# Patient Record
Sex: Female | Born: 2000 | Race: White | Hispanic: No | Marital: Single | Smoking: Never smoker
Health system: Southern US, Community
[De-identification: ages and names within clinical notes are randomized; demographics above are authoritative.]

## PROBLEM LIST (undated history)

## (undated) DIAGNOSIS — R480 Dyslexia and alexia: Secondary | ICD-10-CM

## (undated) DIAGNOSIS — F32A Depression, unspecified: Secondary | ICD-10-CM

## (undated) DIAGNOSIS — F988 Other specified behavioral and emotional disorders with onset usually occurring in childhood and adolescence: Secondary | ICD-10-CM

## (undated) DIAGNOSIS — F329 Major depressive disorder, single episode, unspecified: Secondary | ICD-10-CM

## (undated) HISTORY — PX: WISDOM TOOTH EXTRACTION: SHX21

---
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
## 2022-02-07 DIAGNOSIS — Z419 Encounter for procedure for purposes other than remedying health state, unspecified: Secondary | ICD-10-CM | POA: Diagnosis not present

## 2022-02-11 DIAGNOSIS — N9089 Other specified noninflammatory disorders of vulva and perineum: Secondary | ICD-10-CM | POA: Diagnosis not present

## 2022-02-11 DIAGNOSIS — N898 Other specified noninflammatory disorders of vagina: Secondary | ICD-10-CM | POA: Diagnosis not present

## 2022-03-10 DIAGNOSIS — Z419 Encounter for procedure for purposes other than remedying health state, unspecified: Secondary | ICD-10-CM | POA: Diagnosis not present

## 2022-04-10 DIAGNOSIS — Z419 Encounter for procedure for purposes other than remedying health state, unspecified: Secondary | ICD-10-CM | POA: Diagnosis not present

## 2022-04-18 DIAGNOSIS — R6882 Decreased libido: Secondary | ICD-10-CM | POA: Diagnosis not present

## 2022-04-18 DIAGNOSIS — F419 Anxiety disorder, unspecified: Secondary | ICD-10-CM | POA: Diagnosis not present

## 2022-04-22 ENCOUNTER — Ambulatory Visit: Payer: Self-pay | Admitting: Family Medicine

## 2022-04-29 ENCOUNTER — Ambulatory Visit (INDEPENDENT_AMBULATORY_CARE_PROVIDER_SITE_OTHER): Payer: Medicaid Other | Admitting: Family Medicine

## 2022-04-29 ENCOUNTER — Encounter: Payer: Self-pay | Admitting: Family Medicine

## 2022-04-29 VITALS — BP 98/58 | HR 100 | Ht 65.5 in | Wt 121.0 lb

## 2022-04-29 DIAGNOSIS — E282 Polycystic ovarian syndrome: Secondary | ICD-10-CM | POA: Diagnosis not present

## 2022-04-29 DIAGNOSIS — F339 Major depressive disorder, recurrent, unspecified: Secondary | ICD-10-CM | POA: Diagnosis not present

## 2022-04-29 DIAGNOSIS — F17200 Nicotine dependence, unspecified, uncomplicated: Secondary | ICD-10-CM | POA: Diagnosis not present

## 2022-04-29 DIAGNOSIS — Z1159 Encounter for screening for other viral diseases: Secondary | ICD-10-CM

## 2022-04-29 DIAGNOSIS — F419 Anxiety disorder, unspecified: Secondary | ICD-10-CM | POA: Diagnosis not present

## 2022-04-29 DIAGNOSIS — Z114 Encounter for screening for human immunodeficiency virus [HIV]: Secondary | ICD-10-CM

## 2022-04-29 LAB — POCT GLYCOSYLATED HEMOGLOBIN (HGB A1C): Hemoglobin A1C: 5 % (ref 4.0–5.6)

## 2022-04-29 MED ORDER — NICOTINE 7 MG/24HR TD PT24: 7.0000 mg | MEDICATED_PATCH | Freq: Every day | TRANSDERMAL | 0 refills | Status: AC

## 2022-04-29 NOTE — Assessment & Plan Note (Signed)
Discussed positive #9 on PHQ-9 with patient, she does not have any active SI or HI and has not ever had any plan to hurt herself.  Her emotional support animals are protective factors for her.  Advised her to call us and the [phone] number as well as go to the emergency room should she start to have feelings of hurting herself.  Also advised her to continue to take her Lexapro and see her therapist regularly.  Will follow-up mood at visit.

## 2022-04-29 NOTE — Assessment & Plan Note (Addendum)
Diagnosed with her OBGYN by US findings. Has Liletta IUD in place with better control in dysfunctional uterine bleeding. No hirsutism appreciated on exam.  Collected lipid panel, A1c, CMP, and CBC.  Will follow results.

## 2022-04-29 NOTE — Assessment & Plan Note (Addendum)
On Lexapro 10 mg daily with improvement. Recommended continuing current dose and meeting with therapist. Provided release of information form for therapist records.  Patient's episodes of feeling lightheaded and dizzy, nauseous, and really hot with a sensation of being out of body could be consistent with panic attacks.  Her last TSH was normal.  Will follow-up at next visit given she has not had any further attacks since starting the Lexapro.

## 2022-04-29 NOTE — Patient Instructions (Signed)
It was great to see you today! Here's what we talked about:  I have collected labs today. I will follow up your results. Make an appointment in 1 month to discuss your mood on lexapro and if you are continuing to have the attacks of nausea, temperature changes, and dizziness. Be sure to stay hydrated and fed throughout the day and get good rest and exercise. These may help in the meantime.  Please let me know if you have any other questions.  Dr. Marcha Dutton

## 2022-04-29 NOTE — Progress Notes (Signed)
SUBJECTIVE:   CHIEF COMPLAINT / HPI:   New patient PMH: multiple ovarian follicles (PCOS) on [place] and evidence of 8.5 cm right ovarian cyst, lower abdominal pain and dysfunctional uterine bleeding, hx bacterial vaginosis, anxiety and depression seeing a therapist Tanna Furry LCSW at [phone]), ADHD, has hx of condition where she get lightheaded/dizzy, nauseous, temperature dysregulated, feels out of body; had seizure mother's day 2016 due to "stress" with no further episodes  Hosp/Surg: no hospitalizations but seen in ED for "busted neck veins in R neck" after falling off the bed with a friend, had wisdom teeth removed and removed impacted tooth "in sinus cavity"  Meds: Liletta IUD in place since May 2023, lexapro 10 mg daily since 04/18/22  Allergies: red dye 40 - need a lot of it and get lots of emesis and "internally itchy" and break out to hives if on skin, penicillin allergy in family but she has never had a reaction  FH: mother deceased from [place] with bipolar disorder, MS, PCN allergy, COPD, breast cancer, anxiety/depression, eating disorder, DM, heart disease; father deceased from cirrhosis had lung cancer; grandma with lung cancer; grandpa with heart disease  SH: lives in [place] and goes to [employer], nobody else in the home, boyfriend stays with her most nights, sexually active with 1 female partner, drinks socially, smokes weed 3 blunts a week, smokes vapes but trying to quit  OBJECTIVE:   BP (!) 98/58   Pulse 100   Ht 5' 5.5" (1.664 m)   Wt 121 lb (54.9 kg)   SpO2 98%   BMI 19.83 kg/m   General: Alert and oriented, in NAD Skin: Warm, dry, and intact; pedunculated hyperpigmented mole on right abdomen HEENT: NCAT, EOM grossly normal, midline nasal septum Cardiac: RRR, no m/r/g appreciated Respiratory: CTAB, breathing and speaking comfortably on RA Abdominal: Soft, nontender, nondistended, normoactive bowel sounds Extremities: Moves all extremities grossly  equally Neurological: No gross focal deficit Psychiatric: Appropriate mood and affect, PHQ-9 = 23 with positive #9 = 1  ASSESSMENT/PLAN:   PCOS (polycystic ovarian syndrome) Diagnosed with her OBGYN by US findings. Has Liletta IUD in place with better control in dysfunctional uterine bleeding. No hirsutism appreciated on exam.  Collected lipid panel, A1c, CMP, and CBC.  Will follow results.  Anxiety On Lexapro 10 mg daily with improvement. Recommended continuing current dose and meeting with therapist. Provided release of information form for therapist records.  Patient's episodes of feeling lightheaded and dizzy, nauseous, and really hot with a sensation of being out of body could be consistent with panic attacks.  Her last TSH was normal.  Will follow-up at next visit given she has not had any further attacks since starting the Lexapro.  Depression, recurrent ([place]) Discussed positive #9 on PHQ-9 with patient, she does not have any active SI or HI and has not ever had any plan to hurt herself.  Her emotional support animals are protective factors for her.  Advised her to call us and the 988 number as well as go to the emergency room should she start to have feelings of hurting herself.  Also advised her to continue to take her Lexapro and see her therapist regularly.  Will follow-up mood at visit.  Vaping nicotine dependence, non-tobacco product Patient vapes daily with an unknown amount of nicotine.  She is interested in cutting down.  Will prescribe 7 mg nicotine patches and advised her to let me know should these not work at this dose and can go  up as needed.   Health maintenance Last pap smear in 07/2022; patient will [facility]. Not due for repeat until 2027. HIV and HCV screening obtained today.  Ethelene Hal, MD [place]

## 2022-04-29 NOTE — Assessment & Plan Note (Signed)
Patient vapes daily with an unknown amount of nicotine.  She is interested in cutting down.  Will prescribe 7 mg nicotine patches and advised her to let me know should these not work at this dose and can go up as needed.

## 2022-04-30 LAB — COMPREHENSIVE METABOLIC PANEL
ALT: 12 IU/L (ref 0–32)
AST: 19 IU/L (ref 0–40)
Albumin/Globulin Ratio: 2.6 — ABNORMAL HIGH (ref 1.2–2.2)
Albumin: 5.1 g/dL — ABNORMAL HIGH (ref 4.0–5.0)
Alkaline Phosphatase: 77 IU/L (ref 44–121)
BUN/Creatinine Ratio: 10 (ref 9–23)
BUN: 7 mg/dL (ref 6–20)
Bilirubin Total: 0.6 mg/dL (ref 0.0–1.2)
CO2: 21 mmol/L (ref 20–29)
Calcium: 9.4 mg/dL (ref 8.7–10.2)
Chloride: 102 mmol/L (ref 96–106)
Creatinine, Ser: 0.67 mg/dL (ref 0.57–1.00)
Globulin, Total: 2 g/dL (ref 1.5–4.5)
Glucose: 86 mg/dL (ref 70–99)
Potassium: 3.7 mmol/L (ref 3.5–5.2)
Sodium: 139 mmol/L (ref 134–144)
Total Protein: 7.1 g/dL (ref 6.0–8.5)
eGFR: 126 mL/min/{1.73_m2} (ref >59)

## 2022-04-30 LAB — CBC WITH DIFFERENTIAL/PLATELET
Basophils Absolute: 0.1 10*3/uL (ref 0.0–0.2)
Basos: 1 %
EOS (ABSOLUTE): 0 10*3/uL (ref 0.0–0.4)
Eos: 0 %
Hematocrit: 44 % (ref 34.0–46.6)
Hemoglobin: 15 g/dL (ref 11.1–15.9)
Immature Grans (Abs): 0 10*3/uL (ref 0.0–0.1)
Immature Granulocytes: 0 %
Lymphocytes Absolute: 2.9 10*3/uL (ref 0.7–3.1)
Lymphs: 40 %
MCH: 29.9 pg (ref 26.6–33.0)
MCHC: 34.1 g/dL (ref 31.5–35.7)
MCV: 88 fL (ref 79–97)
Monocytes Absolute: 0.5 10*3/uL (ref 0.1–0.9)
Monocytes: 7 %
Neutrophils Absolute: 3.7 10*3/uL (ref 1.4–7.0)
Neutrophils: 52 %
Platelets: 237 10*3/uL (ref 150–450)
RBC: 5.01 x10E6/uL (ref 3.77–5.28)
RDW: 13 % (ref 11.7–15.4)
WBC: 7.2 10*3/uL (ref 3.4–10.8)

## 2022-04-30 LAB — HCV INTERPRETATION

## 2022-04-30 LAB — LIPID PANEL
Chol/HDL Ratio: 2.1 ratio (ref 0.0–4.4)
Cholesterol, Total: 156 mg/dL (ref 100–199)
HDL: 73 mg/dL (ref >39)
LDL Chol Calc (NIH): 71 mg/dL (ref 0–99)
Triglycerides: 56 mg/dL (ref 0–149)
VLDL Cholesterol Cal: 12 mg/dL (ref 5–40)

## 2022-04-30 LAB — HIV ANTIBODY (ROUTINE TESTING W REFLEX): HIV Screen 4th Generation wRfx: NONREACTIVE

## 2022-04-30 LAB — HCV AB W REFLEX TO QUANT PCR: HCV Ab: NONREACTIVE

## 2022-05-09 DIAGNOSIS — Z419 Encounter for procedure for purposes other than remedying health state, unspecified: Secondary | ICD-10-CM | POA: Diagnosis not present

## 2022-05-16 DIAGNOSIS — F419 Anxiety disorder, unspecified: Secondary | ICD-10-CM | POA: Diagnosis not present

## 2022-05-29 ENCOUNTER — Ambulatory Visit (INDEPENDENT_AMBULATORY_CARE_PROVIDER_SITE_OTHER): Payer: Medicaid Other | Admitting: Family Medicine

## 2022-05-29 ENCOUNTER — Other Ambulatory Visit: Payer: Self-pay

## 2022-05-29 VITALS — BP 114/72 | HR 93 | Ht 65.5 in | Wt 129.4 lb

## 2022-05-29 DIAGNOSIS — F17200 Nicotine dependence, unspecified, uncomplicated: Secondary | ICD-10-CM | POA: Diagnosis not present

## 2022-05-29 DIAGNOSIS — F339 Major depressive disorder, recurrent, unspecified: Secondary | ICD-10-CM | POA: Diagnosis not present

## 2022-05-29 DIAGNOSIS — L700 Acne vulgaris: Secondary | ICD-10-CM

## 2022-05-29 DIAGNOSIS — E282 Polycystic ovarian syndrome: Secondary | ICD-10-CM | POA: Diagnosis not present

## 2022-05-29 NOTE — Progress Notes (Signed)
    SUBJECTIVE:   CHIEF COMPLAINT / HPI:   Anxiety and depression Increased to 20 mg lexapro with OB/GYN at last visit. Depressive symptoms and anxious symptoms have improved. She is also able to think clearly. Has had some weight gain and "breaking out" with this. Had some stomach burning in the morning but this has improved. Wants to go to the gym and eat better to control weight first. Breaking out worse with starting medicine but not worse since increasing the dose. She uses hyaluronic acid in morning and micellar wash at night with cetaphil at times. She also uses a tool to pop some pimples.  PERTINENT  PMH / PSH: PCOS, ADHD  OBJECTIVE:   BP 114/72   Pulse 93   Ht 5' 5.5" (1.664 m)   Wt 129 lb 6.4 oz (58.7 kg)   SpO2 99%   BMI 21.21 kg/m   General: Alert and oriented, in NAD Skin: Warm, dry, and intact; multiple cystic papules mostly clustered around cheeks and jawline with some remaining hyperpigmentation HEENT: NCAT, EOM grossly normal, midline nasal septum Cardiac: RRR, no m/r/g appreciated Respiratory: CTAB, breathing and speaking comfortably on RA Abdominal: Nondistended Extremities: Moves all extremities grossly equally Neurological: No gross focal deficit Psychiatric: Appropriate mood and affect, PHQ-9 10 with negative #9  ASSESSMENT/PLAN:   PCOS (polycystic ovarian syndrome) Metabolic labs from last visit WNL. Consider cystic acne on face (and history of acne on back) to be secondary to PCOS vs SSRI. Will continue to monitor clinically.  Depression, recurrent ([place]) Much improved subjective mood and PHQ-9 since titration of lexapro to 20 mg. Weight gain and acne are both uncommon side effects, though will continue to monitor progression and adjust therapy as needed. Discussed exercise and nutritional changes for mild weight gain of 8 pounds. Discussed acne treatment as below. Follow up mood and symptoms in 1 month.  Vaping nicotine dependence, non-tobacco  product Has not used nicotine patches but does have them. She will start using these to cut down on vaping once she is ready.  Cystic acne vulgaris Worsened after starting lexapro on face. Has history of facial and back acne of similar appearance. Cystic appearance on exam; consider some element secondary to PCOS. Recommended against use of metal tools that could increase local irritation/damage. Discussed adding benzoyl peroxide +/- differin gel to regimen and assessing improvement. If continued symptoms, could discuss oral hormonal therapy given localized effects of IUD likely insufficient to control hormonal aspect.  Health maintenance States she is obtaining her pap smears with OB/GYN for now. She is going to obtain her next one in May 2024 (last note erroneously noted 07/2022 as most recent when patient explained 07/2021). Will continue to await records from OB/GYN.  Ethelene Hal, MD [place]

## 2022-05-29 NOTE — Assessment & Plan Note (Signed)
Much improved subjective mood and PHQ-9 since titration of lexapro to 20 mg. Weight gain and acne are both uncommon side effects, though will continue to monitor progression and adjust therapy as needed. Discussed exercise and nutritional changes for mild weight gain of 8 pounds. Discussed acne treatment as below. Follow up mood and symptoms in 1 month.

## 2022-05-29 NOTE — Assessment & Plan Note (Signed)
Has not used nicotine patches but does have them. She will start using these to cut down on vaping once she is ready.

## 2022-05-29 NOTE — Assessment & Plan Note (Signed)
Worsened after starting lexapro on face. Has history of facial and back acne of similar appearance. Cystic appearance on exam; consider some element secondary to PCOS. Recommended against use of metal tools that could increase local irritation/damage. Discussed adding benzoyl peroxide +/- differin gel to regimen and assessing improvement. If continued symptoms, could discuss oral hormonal therapy given localized effects of IUD likely insufficient to control hormonal aspect.

## 2022-05-29 NOTE — Patient Instructions (Signed)
It was great to see you today! Here's what we talked about:  Continue your current dose of lexapro. I am happy to hear it is helping. Come back in 1 month to see how you are doing on this dose and monitor your break outs.  Try to use some benzoyl peroxide daily with your current regimen for your break outs. You can also try differin gel if this does not work well. Great job with the exercise and diet changes! Keep it up!  Please let me know if you have any other questions. See you soon!  Dr. Marcha Dutton

## 2022-05-29 NOTE — Assessment & Plan Note (Signed)
Metabolic labs from last visit WNL. Consider cystic acne on face (and history of acne on back) to be secondary to PCOS vs SSRI. Will continue to monitor clinically.

## 2022-06-09 DIAGNOSIS — Z419 Encounter for procedure for purposes other than remedying health state, unspecified: Secondary | ICD-10-CM | POA: Diagnosis not present

## 2022-07-09 DIAGNOSIS — Z419 Encounter for procedure for purposes other than remedying health state, unspecified: Secondary | ICD-10-CM | POA: Diagnosis not present

## 2022-08-09 DIAGNOSIS — Z419 Encounter for procedure for purposes other than remedying health state, unspecified: Secondary | ICD-10-CM | POA: Diagnosis not present

## 2022-08-19 ENCOUNTER — Ambulatory Visit (INDEPENDENT_AMBULATORY_CARE_PROVIDER_SITE_OTHER): Payer: Medicaid Other | Admitting: Family Medicine

## 2022-08-19 VITALS — BP 104/67 | HR 85 | Ht 66.0 in | Wt 139.4 lb

## 2022-08-19 DIAGNOSIS — M25551 Pain in right hip: Secondary | ICD-10-CM | POA: Diagnosis not present

## 2022-08-19 DIAGNOSIS — L918 Other hypertrophic disorders of the skin: Secondary | ICD-10-CM | POA: Insufficient documentation

## 2022-08-19 NOTE — Patient Instructions (Addendum)
It was nice seeing you today!  Let me know if you would like physical therapy or to proceed with X-ray.  If you would like the skin tags removed, you can schedule a visit to have this done.  Stay well, Littie Deeds, MD [facility] [phone]  --  Make sure to check out at the front desk before you leave today.  Please arrive at least 15 minutes prior to your scheduled appointments.  If you had blood work today, I will send you a MyChart message or a letter if results are normal. Otherwise, I will give you a call.  If you had a referral placed, they will call you to set up an appointment. Please give [place] a call if you don't hear back in the next 2 weeks.  If you need additional refills before your next appointment, please call your pharmacy first.
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
## 2022-09-08 DIAGNOSIS — Z419 Encounter for procedure for purposes other than remedying health state, unspecified: Secondary | ICD-10-CM | POA: Diagnosis not present

## 2022-10-09 DIAGNOSIS — Z419 Encounter for procedure for purposes other than remedying health state, unspecified: Secondary | ICD-10-CM | POA: Diagnosis not present

## 2022-11-09 DIAGNOSIS — Z419 Encounter for procedure for purposes other than remedying health state, unspecified: Secondary | ICD-10-CM | POA: Diagnosis not present

## 2022-12-09 DIAGNOSIS — Z419 Encounter for procedure for purposes other than remedying health state, unspecified: Secondary | ICD-10-CM | POA: Diagnosis not present

## 2023-01-09 DIAGNOSIS — Z419 Encounter for procedure for purposes other than remedying health state, unspecified: Secondary | ICD-10-CM | POA: Diagnosis not present

## 2023-02-08 DIAGNOSIS — Z419 Encounter for procedure for purposes other than remedying health state, unspecified: Secondary | ICD-10-CM | POA: Diagnosis not present

## 2023-03-11 DIAGNOSIS — Z419 Encounter for procedure for purposes other than remedying health state, unspecified: Secondary | ICD-10-CM | POA: Diagnosis not present

## 2023-04-11 DIAGNOSIS — Z419 Encounter for procedure for purposes other than remedying health state, unspecified: Secondary | ICD-10-CM | POA: Diagnosis not present

## 2023-05-09 DIAGNOSIS — Z419 Encounter for procedure for purposes other than remedying health state, unspecified: Secondary | ICD-10-CM | POA: Diagnosis not present

## 2023-05-18 ENCOUNTER — Ambulatory Visit (INDEPENDENT_AMBULATORY_CARE_PROVIDER_SITE_OTHER): Payer: Medicaid Other | Admitting: Family Medicine

## 2023-05-18 ENCOUNTER — Ambulatory Visit
Admission: RE | Admit: 2023-05-18 | Discharge: 2023-05-18 | Disposition: A | Discharge status: Home / Self Care | Source: Ambulatory Visit | Attending: Family Medicine | Admitting: Family Medicine

## 2023-05-18 VITALS — BP 110/75 | HR 98 | Ht 66.0 in | Wt 178.6 lb

## 2023-05-18 DIAGNOSIS — L918 Other hypertrophic disorders of the skin: Secondary | ICD-10-CM | POA: Diagnosis not present

## 2023-05-18 DIAGNOSIS — R102 Pelvic and perineal pain: Secondary | ICD-10-CM | POA: Diagnosis not present

## 2023-05-18 DIAGNOSIS — N393 Stress incontinence (female) (male): Secondary | ICD-10-CM

## 2023-05-18 DIAGNOSIS — M25551 Pain in right hip: Secondary | ICD-10-CM

## 2023-05-18 DIAGNOSIS — F339 Major depressive disorder, recurrent, unspecified: Secondary | ICD-10-CM

## 2023-05-18 DIAGNOSIS — R32 Unspecified urinary incontinence: Secondary | ICD-10-CM | POA: Insufficient documentation

## 2023-05-18 DIAGNOSIS — M545 Low back pain, unspecified: Secondary | ICD-10-CM | POA: Diagnosis not present

## 2023-05-18 DIAGNOSIS — M25552 Pain in left hip: Secondary | ICD-10-CM

## 2023-05-18 MED ORDER — MELOXICAM 15 MG PO TABS: 15.0000 mg | ORAL_TABLET | Freq: Every day | ORAL | 0 refills | Status: AC

## 2023-05-18 NOTE — Assessment & Plan Note (Signed)
 Most likely etiology of persistent right hip pain.  Could also be some component of gluteal tendinopathy, as well.  Given chronicity and previous reported findings of hairline fracture, will collect repeat radiographs.  Will also try meloxicam 15 mg course for 7 days along with physical therapy referral.  Discussed potential for trochanteric bursa injections in the future if no improvement and x-rays unrevealing.

## 2023-05-18 NOTE — Patient Instructions (Addendum)
 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  Today you had a skin tag removed. Keep the bandage on for 24 hours before getting wet. Let me know if you have increasing pain, redness, discharge or develop any fevers, those these are unlikely.  Come back in 2 weeks for further assessment of your urinary incontinence.  Dental list         Updated 11.20.18 These dentists all accept Medicaid.  The list is a courtesy and for your convenience. Estos dentistas aceptan Medicaid.  La lista es para su [place] y es una cortesa.     [facility]     [phone] [address]  [address] [place] [address] Se habla espaol From 61 to 39 years old Parent may go with child only for cleaning Vinson Moselle DDS     [phone] Milus Banister, DDS (Spanish speaking) [address] [place]  [address] Se habla espaol From 20 to 77 years old Parent may go with child   Marolyn Hammock DMD    [phone] [address] [place] [address] Se habla espaol [place] spoken From 41 years old Parent may go with child [facility]     [phone] [address] [place] [address] Se habla espaol From 89 to 27 years old Parent may NOT go with child  Winfield Rast DDS  [phone] [facility]      [address]  [place]  [address] Se habla espaol [place] spoken (preferred to bring translator) From teeth coming in to 87 years old Parent may go with child  [facility]     [phone] [address] [place]. [place] [address] Requires certification. Call for information. Requiere certificacin. Llame para informacin. Algunos dias se habla espaol  From birth to 20 years Parent possibly goes with child   Bradd Canary DDS     [phone] [address] [place]  [address] [place] [address] Se habla espaol From 18 months to 18 years  Parent may go with child  [facility] DDS     Garlon Hatchet DDS  [phone] [address] [place] [address] Se habla espaol From 34 year old Parent may go with child   Melynda Ripple DDS    [phone] [address] [place] [address] Se habla espaol  From 18 months to 32 years old Parent may go with child Dorian Pod DDS    [phone] [address] [place] [address] Se habla espaol From 10 to 56 years old Parent may go with child  [facility]    [phone] [address] [place] [address] No se Wayne Sever From birth [facility]  [phone] [address] [place] [address] Se habla espanol Interpretation for other languages Special needs children welcome  Geryl Councilman, DDS PA     [phone] [address]  [place], [place] [address] From 25 years old   Special needs children welcome  [facility]   [phone] Dr. Orlean Patten [address] [place], [place] [address] Se habla espaol From birth to 12 years Special needs children welcome   [facility] [place] [phone] [address] [place], [place] [address]   [facility] Janyth Pupa [phone] [address] [place], [place] [address]

## 2023-05-18 NOTE — Assessment & Plan Note (Signed)
 Continue current management given improvement.

## 2023-05-18 NOTE — Assessment & Plan Note (Signed)
 Unable to fully address today due to time constraints.  Consider pelvic floor dysfunction, though would be atypical in a female her age without a history of childbearing.  Less likely UTI given chronicity.  However, at next visit in 2 weeks, recommend urine sample and possible pelvic exam for further elucidation.

## 2023-05-18 NOTE — Progress Notes (Signed)
    SUBJECTIVE:   CHIEF COMPLAINT / HPI:   Right hip pain Continued pain over the right hip.  Also has pain into the right buttocks.  Sometimes has pain that radiates down into the right leg.  No spontaneous loss of bowel or bladder control. Has made it hurt when exercising or hiking. Hurts out of the blue sometimes too. Massaging around the area does help. Never tried PT. Had an XR at the time of th injury around a year ago that was read as a hairline fracture.  Skin tag At the lower back. Has gotten larger over the last couple of months. Sometimes gets snagged on the clothes and is an uncomfortable sensation but does not bleed. Does have a history of skin tags as well as a family history.  Stress urinary incontinence Going on for 4-5 months.  This is happening whenever she sneezes or laughs.  She has not had any children.  Depression, anxiety Has had remarkable improvement with Lexapro.  PERTINENT  PMH / PSH: PCOS, depression, anxiety  OBJECTIVE:   BP 110/75   Pulse 98   Ht 5\' 6"  (1.676 m)   Wt 178 lb 9.6 oz (81 kg)   SpO2 96%   BMI 28.83 kg/m   General: Alert and oriented, in NAD Skin: Warm, dry, and intact; 1 pedunculated subcentimeter acrochordon at lower right back without evidence of irritation or bleeding HEENT: NCAT, EOM grossly normal, midline nasal septum Cardiac: RRR, no m/r/g appreciated Respiratory: CTAB, breathing and speaking comfortably on RA Abdominal: Nondistended, normoactive bowel sounds Extremities: Moves all extremities grossly equally Neurological: No gross focal deficit Psychiatric: Appropriate mood and affect   ASSESSMENT/PLAN:   Greater trochanteric pain syndrome of right lower extremity Most likely etiology of persistent right hip pain.  Could also be some component of gluteal tendinopathy, as well.  Given chronicity and previous reported findings of hairline fracture, will collect repeat radiographs.  Will also try meloxicam 15 mg course for 7  days along with physical therapy referral.  Discussed potential for trochanteric bursa injections in the future if no improvement and x-rays unrevealing.  Urinary incontinence Unable to fully address today due to time constraints.  Consider pelvic floor dysfunction, though would be atypical in a female her age without a history of childbearing.  Less likely UTI given chronicity.  However, at next visit in 2 weeks, recommend urine sample and possible pelvic exam for further elucidation.  Depression, recurrent (HCC) Continue current management given improvement.   Skin tag removal procedure note Pre-op Diagnosis: Skin tag Post-op Diagnosis: Same Procedure: Skin tag removal Location: Right lower back Performing Physician: Janeal Holmes, MD Supervising Physician (if applicable): Dr. Christella Hartigan  Informed consent was obtained prior to the procedure. Time out performed. The area surrounding the skin lesion was prepared and cleaned with with alcohol prep. The area was sufficiently anesthetized with 1% Lidocaine with epinephrine. A  sterile set of suture scissors  was used to obtain tissue sample and placed in labeled biopsy cup.  Pressure with gauze  was used to obtain hemostasis. The patient tolerated the procedure well without complications.  Standard post-procedure care was explained and return precautions and wound care handout given.   Health maintenance Patient was given list of dental offices that take Medicaid.  Janeal Holmes, MD [facility] [place]

## 2023-05-20 ENCOUNTER — Encounter: Payer: Self-pay | Admitting: Family Medicine

## 2023-05-20 DIAGNOSIS — Z419 Encounter for procedure for purposes other than remedying health state, unspecified: Secondary | ICD-10-CM | POA: Diagnosis not present

## 2023-05-20 LAB — DERMATOLOGY PATHOLOGY

## 2023-06-17 ENCOUNTER — Encounter: Payer: Self-pay | Admitting: Physical Therapy

## 2023-06-17 ENCOUNTER — Other Ambulatory Visit: Payer: Self-pay

## 2023-06-17 ENCOUNTER — Ambulatory Visit: Attending: Family Medicine | Admitting: Physical Therapy

## 2023-06-17 DIAGNOSIS — M6281 Muscle weakness (generalized): Secondary | ICD-10-CM | POA: Insufficient documentation

## 2023-06-17 DIAGNOSIS — R2689 Other abnormalities of gait and mobility: Secondary | ICD-10-CM | POA: Diagnosis not present

## 2023-06-17 DIAGNOSIS — M25561 Pain in right knee: Secondary | ICD-10-CM | POA: Insufficient documentation

## 2023-06-17 DIAGNOSIS — M25552 Pain in left hip: Secondary | ICD-10-CM | POA: Diagnosis not present

## 2023-06-17 DIAGNOSIS — M25551 Pain in right hip: Secondary | ICD-10-CM | POA: Diagnosis not present
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
## 2023-06-20 DIAGNOSIS — Z419 Encounter for procedure for purposes other than remedying health state, unspecified: Secondary | ICD-10-CM | POA: Diagnosis not present

## 2023-07-01 ENCOUNTER — Ambulatory Visit: Admitting: Physical Therapy

## 2023-07-01 DIAGNOSIS — M25561 Pain in right knee: Secondary | ICD-10-CM

## 2023-07-01 DIAGNOSIS — M6281 Muscle weakness (generalized): Secondary | ICD-10-CM

## 2023-07-01 DIAGNOSIS — R2689 Other abnormalities of gait and mobility: Secondary | ICD-10-CM | POA: Diagnosis not present

## 2023-07-01 DIAGNOSIS — M25552 Pain in left hip: Secondary | ICD-10-CM

## 2023-07-01 DIAGNOSIS — M25551 Pain in right hip: Secondary | ICD-10-CM
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
## 2023-07-08 ENCOUNTER — Ambulatory Visit: Admitting: Physical Therapy

## 2023-07-08 DIAGNOSIS — M25561 Pain in right knee: Secondary | ICD-10-CM

## 2023-07-08 DIAGNOSIS — M25551 Pain in right hip: Secondary | ICD-10-CM

## 2023-07-08 DIAGNOSIS — M25552 Pain in left hip: Secondary | ICD-10-CM

## 2023-07-08 DIAGNOSIS — M6281 Muscle weakness (generalized): Secondary | ICD-10-CM | POA: Diagnosis not present

## 2023-07-08 DIAGNOSIS — R2689 Other abnormalities of gait and mobility: Secondary | ICD-10-CM | POA: Diagnosis not present
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
## 2023-07-15 ENCOUNTER — Encounter: Payer: Self-pay | Admitting: Physical Therapy

## 2023-07-15 ENCOUNTER — Ambulatory Visit: Attending: Family Medicine | Admitting: Physical Therapy

## 2023-07-15 DIAGNOSIS — M25561 Pain in right knee: Secondary | ICD-10-CM | POA: Diagnosis not present

## 2023-07-15 DIAGNOSIS — M25552 Pain in left hip: Secondary | ICD-10-CM | POA: Insufficient documentation

## 2023-07-15 DIAGNOSIS — M25551 Pain in right hip: Secondary | ICD-10-CM | POA: Insufficient documentation

## 2023-07-15 DIAGNOSIS — M6281 Muscle weakness (generalized): Secondary | ICD-10-CM | POA: Diagnosis not present
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
## 2023-07-17 DIAGNOSIS — Z0000 Encounter for general adult medical examination without abnormal findings: Secondary | ICD-10-CM | POA: Diagnosis not present

## 2023-07-17 DIAGNOSIS — N761 Subacute and chronic vaginitis: Secondary | ICD-10-CM | POA: Diagnosis not present

## 2023-07-17 DIAGNOSIS — N898 Other specified noninflammatory disorders of vagina: Secondary | ICD-10-CM | POA: Diagnosis not present

## 2023-07-17 DIAGNOSIS — Z0001 Encounter for general adult medical examination with abnormal findings: Secondary | ICD-10-CM | POA: Diagnosis not present

## 2023-07-17 DIAGNOSIS — B9689 Other specified bacterial agents as the cause of diseases classified elsewhere: Secondary | ICD-10-CM | POA: Diagnosis not present

## 2023-07-17 DIAGNOSIS — Z975 Presence of (intrauterine) contraceptive device: Secondary | ICD-10-CM | POA: Diagnosis not present

## 2023-07-17 DIAGNOSIS — Z803 Family history of malignant neoplasm of breast: Secondary | ICD-10-CM | POA: Diagnosis not present

## 2023-07-17 DIAGNOSIS — Z113 Encounter for screening for infections with a predominantly sexual mode of transmission: Secondary | ICD-10-CM | POA: Diagnosis not present

## 2023-07-17 DIAGNOSIS — Z124 Encounter for screening for malignant neoplasm of cervix: Secondary | ICD-10-CM | POA: Diagnosis not present

## 2023-07-20 DIAGNOSIS — Z419 Encounter for procedure for purposes other than remedying health state, unspecified: Secondary | ICD-10-CM | POA: Diagnosis not present

## 2023-07-22 ENCOUNTER — Ambulatory Visit: Admitting: Physical Therapy

## 2023-07-22 ENCOUNTER — Encounter: Payer: Self-pay | Admitting: Physical Therapy

## 2023-07-22 DIAGNOSIS — M25552 Pain in left hip: Secondary | ICD-10-CM | POA: Diagnosis not present

## 2023-07-22 DIAGNOSIS — M25551 Pain in right hip: Secondary | ICD-10-CM

## 2023-07-22 DIAGNOSIS — M25561 Pain in right knee: Secondary | ICD-10-CM | POA: Diagnosis not present

## 2023-07-22 DIAGNOSIS — M6281 Muscle weakness (generalized): Secondary | ICD-10-CM | POA: Diagnosis not present
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
## 2023-07-29 ENCOUNTER — Ambulatory Visit: Admitting: Physical Therapy

## 2023-07-29 ENCOUNTER — Encounter: Payer: Self-pay | Admitting: Physical Therapy

## 2023-07-29 DIAGNOSIS — M25551 Pain in right hip: Secondary | ICD-10-CM | POA: Diagnosis not present

## 2023-07-29 DIAGNOSIS — M6281 Muscle weakness (generalized): Secondary | ICD-10-CM

## 2023-07-29 DIAGNOSIS — M25552 Pain in left hip: Secondary | ICD-10-CM

## 2023-07-29 DIAGNOSIS — M25561 Pain in right knee: Secondary | ICD-10-CM
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
## 2023-08-05 ENCOUNTER — Ambulatory Visit: Admitting: Physical Therapy

## 2023-08-05 ENCOUNTER — Encounter: Payer: Self-pay | Admitting: Physical Therapy

## 2023-08-05 DIAGNOSIS — M25551 Pain in right hip: Secondary | ICD-10-CM

## 2023-08-05 DIAGNOSIS — M25552 Pain in left hip: Secondary | ICD-10-CM

## 2023-08-05 DIAGNOSIS — M6281 Muscle weakness (generalized): Secondary | ICD-10-CM | POA: Diagnosis not present

## 2023-08-05 DIAGNOSIS — M25561 Pain in right knee: Secondary | ICD-10-CM | POA: Diagnosis not present
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
## 2023-08-20 DIAGNOSIS — Z419 Encounter for procedure for purposes other than remedying health state, unspecified: Secondary | ICD-10-CM | POA: Diagnosis not present

## 2023-08-31 DIAGNOSIS — Z803 Family history of malignant neoplasm of breast: Secondary | ICD-10-CM | POA: Diagnosis not present

## 2023-08-31 DIAGNOSIS — Z800 Family history of malignant neoplasm of digestive organs: Secondary | ICD-10-CM | POA: Diagnosis not present

## 2023-08-31 DIAGNOSIS — Z7183 Encounter for nonprocreative genetic counseling: Secondary | ICD-10-CM | POA: Diagnosis not present

## 2023-08-31 DIAGNOSIS — Z1379 Encounter for other screening for genetic and chromosomal anomalies: Secondary | ICD-10-CM | POA: Diagnosis not present

## 2023-08-31 DIAGNOSIS — Z9189 Other specified personal risk factors, not elsewhere classified: Secondary | ICD-10-CM | POA: Diagnosis not present

## 2023-09-02 DIAGNOSIS — B9689 Other specified bacterial agents as the cause of diseases classified elsewhere: Secondary | ICD-10-CM | POA: Diagnosis not present

## 2023-09-02 DIAGNOSIS — N760 Acute vaginitis: Secondary | ICD-10-CM | POA: Diagnosis not present

## 2023-09-02 DIAGNOSIS — Z113 Encounter for screening for infections with a predominantly sexual mode of transmission: Secondary | ICD-10-CM | POA: Diagnosis not present

## 2023-09-02 DIAGNOSIS — F419 Anxiety disorder, unspecified: Secondary | ICD-10-CM | POA: Diagnosis not present

## 2023-09-02 DIAGNOSIS — Z975 Presence of (intrauterine) contraceptive device: Secondary | ICD-10-CM | POA: Diagnosis not present

## 2023-09-16 NOTE — Telephone Encounter (Signed)
 Called pt and discussed genetics results. See addendum to note from 08/31/23 for full results and discussion.

## 2023-09-19 DIAGNOSIS — Z419 Encounter for procedure for purposes other than remedying health state, unspecified: Secondary | ICD-10-CM | POA: Diagnosis not present

## 2023-10-20 DIAGNOSIS — Z419 Encounter for procedure for purposes other than remedying health state, unspecified: Secondary | ICD-10-CM | POA: Diagnosis not present

## 2023-11-20 DIAGNOSIS — Z419 Encounter for procedure for purposes other than remedying health state, unspecified: Secondary | ICD-10-CM | POA: Diagnosis not present

## 2023-12-20 DIAGNOSIS — Z419 Encounter for procedure for purposes other than remedying health state, unspecified: Secondary | ICD-10-CM | POA: Diagnosis not present

## 2024-02-23 ENCOUNTER — Ambulatory Visit
Admission: RE | Admit: 2024-02-23 | Discharge: 2024-02-23 | Disposition: A | Source: Ambulatory Visit | Attending: Family Medicine

## 2024-02-23 ENCOUNTER — Ambulatory Visit: Payer: Self-pay | Admitting: Family Medicine

## 2024-02-23 ENCOUNTER — Ambulatory Visit: Admitting: Family Medicine

## 2024-02-23 ENCOUNTER — Encounter: Payer: Self-pay | Admitting: Family Medicine

## 2024-02-23 VITALS — BP 114/84 | HR 109 | Temp 98.2°F | Wt 193.8 lb

## 2024-02-23 DIAGNOSIS — R0600 Dyspnea, unspecified: Secondary | ICD-10-CM | POA: Diagnosis not present

## 2024-02-23 DIAGNOSIS — R0602 Shortness of breath: Secondary | ICD-10-CM

## 2024-02-23 NOTE — Progress Notes (Signed)
° ° °  SUBJECTIVE:   CHIEF COMPLAINT / HPI:   Productive cough, nasal congestion, body aches, fevers since Thursday (6 days) Had negative COVID tests at home Today developed shortness of breath and pleuritic chest pain described as burning States that her shortness of breath worsens with exertion, she became winded walking to the exam room from the front door She also experienced episode of hemoptysis and coughing up brown sputum today  No long travel recently Hormonal IUD Quit smoking tobacco 6 months ago No hx blood clots   PERTINENT  PMH / PSH: PCOS, anxiety, depression  OBJECTIVE:   BP 114/84   Pulse (!) 109   Temp 98.2 F (36.8 C) (Oral)   Wt 193 lb 12.8 oz (87.9 kg)   SpO2 96%   BMI 31.28 kg/m   General: well appearing, NAD Cardiovascular: RRR, no m/r/g Respiratory: CTAB, mildly short of breath with talking multiple sentences.  Extremities: No swelling BLE. No calf redness or tenderness.   ASSESSMENT/PLAN:   Assessment & Plan Shortness of breath Given episode of hemoptysis, pleuritic chest pain, tachycardia I am concerned for possible PE.  I advised to go to the emergency room for CT PE versus obtaining stat D-dimer and chest x-ray.  Patient would prefer to workup as much outpatient as possible, I advised her if the D-dimer is positive she will need to go to the emergency room.  She is comfortable with this plan. Differential also includes pneumonia, viral illness.  Exam reassuring but will evaluate chest x-ray once it is done. ED precautions discussed     Elyce Prescott, DO [facility]

## 2024-02-23 NOTE — Patient Instructions (Addendum)
 Good to see you today - Thank you for coming in  Things we discussed today:  I am ordering a test called a D-dimer.  If this is negative, we can be reassured that you do not have a blood clot.  I am also ordering a chest x-ray to make sure you do not have any pneumonia.  If this is all normal, this is likely residual from a viral illness.  If you start feeling worse in any way please go to the emergency room.  Address for the chest x-ray is [address], you do not need an appointment

## 2024-02-24 LAB — D-DIMER, QUANTITATIVE: D-DIMER: 0.29 mg{FEU}/L (ref 0.00–0.49)
